# Patient Record
Sex: Female | Born: 1985 | Race: White | Hispanic: No | Marital: Single | Smoking: Never smoker
Health system: Southern US, Community
[De-identification: ages and names within clinical notes are randomized; demographics above are authoritative.]

---

## 2015-10-30 ENCOUNTER — Encounter (HOSPITAL_COMMUNITY): Payer: Self-pay | Admitting: Emergency Medicine

## 2015-10-30 ENCOUNTER — Emergency Department (HOSPITAL_COMMUNITY)
Admission: EM | Admit: 2015-10-30 | Discharge: 2015-10-30 | Disposition: A | Discharge status: Home / Self Care | Payer: Worker's Compensation | Attending: Emergency Medicine | Admitting: Emergency Medicine

## 2015-10-30 ENCOUNTER — Emergency Department (HOSPITAL_COMMUNITY): Payer: Worker's Compensation

## 2015-10-30 DIAGNOSIS — S8992XA Unspecified injury of left lower leg, initial encounter: Secondary | ICD-10-CM | POA: Diagnosis present

## 2015-10-30 DIAGNOSIS — W010XXA Fall on same level from slipping, tripping and stumbling without subsequent striking against object, initial encounter: Secondary | ICD-10-CM | POA: Insufficient documentation

## 2015-10-30 DIAGNOSIS — S8392XA Sprain of unspecified site of left knee, initial encounter: Secondary | ICD-10-CM | POA: Insufficient documentation

## 2015-10-30 DIAGNOSIS — Y9289 Other specified places as the place of occurrence of the external cause: Secondary | ICD-10-CM | POA: Diagnosis not present

## 2015-10-30 DIAGNOSIS — Y999 Unspecified external cause status: Secondary | ICD-10-CM | POA: Diagnosis not present

## 2015-10-30 DIAGNOSIS — S39012A Strain of muscle, fascia and tendon of lower back, initial encounter: Secondary | ICD-10-CM | POA: Diagnosis not present

## 2015-10-30 DIAGNOSIS — S8002XA Contusion of left knee, initial encounter: Secondary | ICD-10-CM | POA: Diagnosis not present

## 2015-10-30 DIAGNOSIS — Y9301 Activity, walking, marching and hiking: Secondary | ICD-10-CM | POA: Insufficient documentation

## 2015-10-30 MED ORDER — HYDROCODONE-ACETAMINOPHEN 5-325 MG PO TABS
2.0000 | ORAL_TABLET | Freq: Once | ORAL | Status: AC
  Administered 2015-10-30: 2 via ORAL
  Filled 2015-10-30: qty 2

## 2015-10-30 MED ORDER — IBUPROFEN 800 MG PO TABS
800.0000 mg | ORAL_TABLET | Freq: Once | ORAL | Status: AC
  Administered 2015-10-30: 800 mg via ORAL
  Filled 2015-10-30: qty 1

## 2015-10-30 MED ORDER — IBUPROFEN 600 MG PO TABS: 600.0000 mg | ORAL_TABLET | Freq: Four times a day (QID) | ORAL | Status: AC | PRN

## 2015-10-30 MED ORDER — HYDROCODONE-ACETAMINOPHEN 5-325 MG PO TABS: 1.0000 | ORAL_TABLET | ORAL | Status: AC | PRN

## 2015-10-30 MED ORDER — DIAZEPAM 5 MG PO TABS
5.0000 mg | ORAL_TABLET | Freq: Once | ORAL | Status: AC
  Administered 2015-10-30: 5 mg via ORAL
  Filled 2015-10-30: qty 1

## 2015-10-30 MED ORDER — METHOCARBAMOL 500 MG PO TABS: 500.0000 mg | ORAL_TABLET | Freq: Three times a day (TID) | ORAL | Status: AC

## 2015-10-30 NOTE — ED Notes (Signed)
Pt verbalized understanding of no driving and to use caution within 4 hours of taking pain meds due to meds cause drowsiness 

## 2015-10-30 NOTE — ED Notes (Signed)
[place] [employer] and slipped and fell walking down the hallway.  C/o left knee pain.

## 2015-10-30 NOTE — ED Provider Notes (Signed)
CSN: [id number]     Arrival date & time 10/30/15  0848 History   First MD Initiated Contact with Patient 10/30/15 [phone]     Chief Complaint  Patient presents with  . Fall  . Knee Pain     (Consider location/radiation/quality/duration/timing/severity/associated sxs/prior Treatment) Patient is a 30 y.o. female presenting with fall and knee pain. The history is provided by the patient.  Fall Pertinent negatives include no abdominal pain, arthralgias, chest pain, coughing or neck pain.  Knee Pain Associated symptoms: no back pain and no neck pain     History reviewed. No pertinent past medical history. History reviewed. No pertinent past surgical history. History reviewed. No pertinent family history. Social History  Substance Use Topics  . Smoking status: Never Smoker   . Smokeless tobacco: None  . Alcohol Use: No   OB History    No data available     Review of Systems  Constitutional: Negative for activity change.       All ROS Neg except as noted in HPI  HENT: Negative for nosebleeds.   Eyes: Negative for photophobia and discharge.  Respiratory: Negative for cough, shortness of breath and wheezing.   Cardiovascular: Negative for chest pain and palpitations.  Gastrointestinal: Negative for abdominal pain and blood in stool.  Genitourinary: Negative for dysuria, frequency and hematuria.  Musculoskeletal: Negative for back pain, arthralgias and neck pain.  Skin: Negative.   Neurological: Negative for dizziness, seizures and speech difficulty.  Psychiatric/Behavioral: Negative for hallucinations and confusion.      Allergies  Review of patient's allergies indicates no known allergies.  Home Medications   Prior to Admission medications   Not on File   BP 101/71 mmHg  Pulse 69  Temp(Src) 98.3 F (36.8 C) (Oral)  Resp 16  Ht 5\' 2"  (1.575 m)  Wt 113.399 kg  BMI 45.71 kg/m2  SpO2 100%  LMP 10/23/2015 Physical Exam  Constitutional: She is oriented to person,  place, and time. She appears well-developed and well-nourished.  Non-toxic appearance.  HENT:  Head: Normocephalic.  Right Ear: Tympanic membrane and external ear normal.  Left Ear: Tympanic membrane and external ear normal.  Eyes: EOM and lids are normal. Pupils are equal, round, and reactive to light.  Neck: Normal range of motion. Neck supple. Carotid bruit is not present.  Cardiovascular: Normal rate, regular rhythm, normal heart sounds, intact distal pulses and normal pulses.   Pulmonary/Chest: Breath sounds normal. No respiratory distress.  Abdominal: Soft. Bowel sounds are normal. There is no tenderness. There is no guarding.  Musculoskeletal: Normal range of motion.  Lymphadenopathy:       Head (right side): No submandibular adenopathy present.       Head (left side): No submandibular adenopathy present.    She has no cervical adenopathy.  Neurological: She is alert and oriented to person, place, and time. She has normal strength. No cranial nerve deficit or sensory deficit.  Skin: Skin is warm and dry.  Psychiatric: She has a normal mood and affect. Her speech is normal.  Nursing note and vitals reviewed.   ED Course  Procedures (including critical care time) Labs Review Labs Reviewed - No data to display  Imaging Review No results found. I have personally reviewed and evaluated these images and lab results as part of my medical decision-making.   EKG Interpretation None      MDM  Vital signs within normal limits. Patient sustained a fall while at work today. The examination favors contusion of  the left knee, and lumbar strain. X-ray of the left knee is negative for fracture dislocation or effusion.  Patient fitted with a knee immobilizer and crutches. Ice pack provided. Prescription for Robaxin, Motrin, and Norco given to the patient. The patient is given a work excuse.    Final diagnoses:  Contusion of left knee, initial encounter  Left knee sprain, initial  encounter  Lumbar strain, initial encounter    **I have reviewed nursing notes, vital signs, and all appropriate lab and imaging results for this patient.Ivery Quale*    Ameera Tigue, PA-C 10/31/15 1359  Glynn OctaveStephen Rancour, MD 10/31/15 [phone]

## 2015-10-30 NOTE — Discharge Instructions (Signed)
Your x-rays are negative for fracture or dislocation. Your examination favors lumbar strain, left knee contusion, and left knee strain. Use the knee immobilizer over the next 5-7 days. Please use crutches until you can safely apply weight to your left lower extremity. Please see Dr. Romeo AppleHarrison for evaluation if not improving. Please use ibuprofen every 6 hours for discomfort and inflammation. Use Robaxin 3 times daily for spasm pain. Use Norco for more severe pain. Norco and Robaxin may cause drowsiness. Please do not drink alcohol, operate a vehicle, operating machinery, or participate in activities requiring concentration when taking either these 2 medications. Please apply ice pack to your knee. Knee Sprain A knee sprain is a tear in the strong bands of tissue that connect the bones (ligaments) of your knee. HOME CARE  Raise (elevate) your injured knee to lessen puffiness (swelling).  To ease pain and puffiness, put ice on the injured area.  Put ice in a plastic bag.  Place a towel between your skin and the bag.  Leave the ice on for 20 minutes, 2-3 times a day.  Only take medicine as told by your doctor.  Do not leave your knee unprotected until pain and stiffness go away (usually 4-6 weeks).  If you have a cast or splint, do not get it wet. If your doctor told you to not take it off, cover it with a plastic bag when you shower or bathe. Do not swim.  Your doctor may have you do exercises to prevent or limit permanent weakness and stiffness. GET HELP RIGHT AWAY IF:   Your cast or splint becomes damaged.  Your pain gets worse.  You have a lot of pain, puffiness, or numbness below the cast or splint. MAKE SURE YOU:   Understand these instructions.  Will watch your condition.  Will get help right away if you are not doing well or get worse.   This information is not intended to replace advice given to you by your health care provider. Make sure you discuss any questions you have  with your health care provider.   Document Released: 04/05/2009 Document Revised: 04/22/2013 Document Reviewed: 12/24/2012 Elsevier Interactive Patient Education 2016 [employer]  Lumbosacral Strain Lumbosacral strain is a strain of any of the parts that make up your lumbosacral vertebrae. Your lumbosacral vertebrae are the bones that make up the lower third of your backbone. Your lumbosacral vertebrae are held together by muscles and tough, fibrous tissue (ligaments).  CAUSES  A sudden blow to your back can cause lumbosacral strain. Also, anything that causes an excessive stretch of the muscles in the low back can cause this strain. This is typically seen when people exert themselves strenuously, fall, lift heavy objects, bend, or crouch repeatedly. RISK FACTORS  Physically demanding work.  Participation in pushing or pulling sports or sports that require a sudden twist of the back (tennis, golf, baseball).  Weight lifting.  Excessive lower back curvature.  Forward-tilted pelvis.  Weak back or abdominal muscles or both.  Tight hamstrings. SIGNS AND SYMPTOMS  Lumbosacral strain may cause pain in the area of your injury or pain that moves (radiates) down your leg.  DIAGNOSIS Your health care provider can often diagnose lumbosacral strain through a physical exam. In some cases, you may need tests such as X-ray exams.  TREATMENT  Treatment for your lower back injury depends on many factors that your clinician will have to evaluate. However, most treatment will include the use of anti-inflammatory medicines. HOME CARE INSTRUCTIONS  Avoid hard physical activities (tennis, racquetball, waterskiing) if you are not in proper physical condition for it. This may aggravate or create problems.  If you have a back problem, avoid sports requiring sudden body movements. Swimming and walking are generally safer activities.  Maintain good posture.  Maintain a healthy weight.  For acute  conditions, you may put ice on the injured area.  Put ice in a plastic bag.  Place a towel between your skin and the bag.  Leave the ice on for 20 minutes, 2-3 times a day.  When the low back starts healing, stretching and strengthening exercises may be recommended. SEEK MEDICAL CARE IF:  Your back pain is getting worse.  You experience severe back pain not relieved with medicines. SEEK IMMEDIATE MEDICAL CARE IF:   You have numbness, tingling, weakness, or problems with the use of your arms or legs.  There is a change in bowel or bladder control.  You have increasing pain in any area of the body, including your belly (abdomen).  You notice shortness of breath, dizziness, or feel faint.  You feel sick to your stomach (nauseous), are throwing up (vomiting), or become sweaty.  You notice discoloration of your toes or legs, or your feet get very cold. MAKE SURE YOU:   Understand these instructions.  Will watch your condition.  Will get help right away if you are not doing well or get worse.   This information is not intended to replace advice given to you by your health care provider. Make sure you discuss any questions you have with your health care provider.   Document Released: 01/25/2005 Document Revised: 05/08/2014 Document Reviewed: 12/04/2012 Elsevier Interactive Patient Education [employer]

## 2016-12-04 IMAGING — DX DG KNEE COMPLETE 4+V*L*
4 series · 4 of 4 positions shown · non-contrast
Comparison: None.

CLINICAL DATA: Fall today with left knee pain, initial encounter

EXAM:
LEFT KNEE - COMPLETE 4+ VIEW

[knee ap]
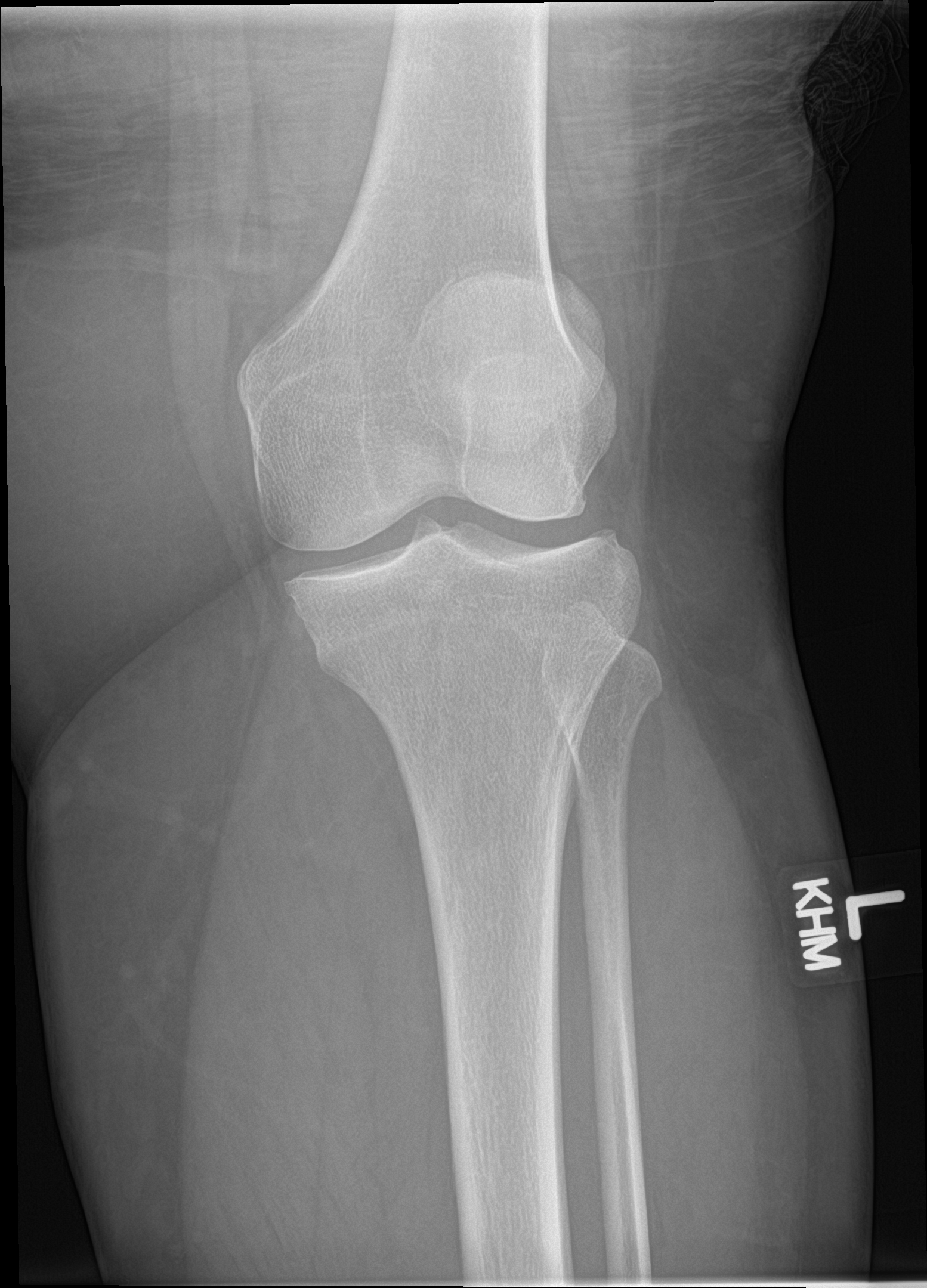

[tunnel]
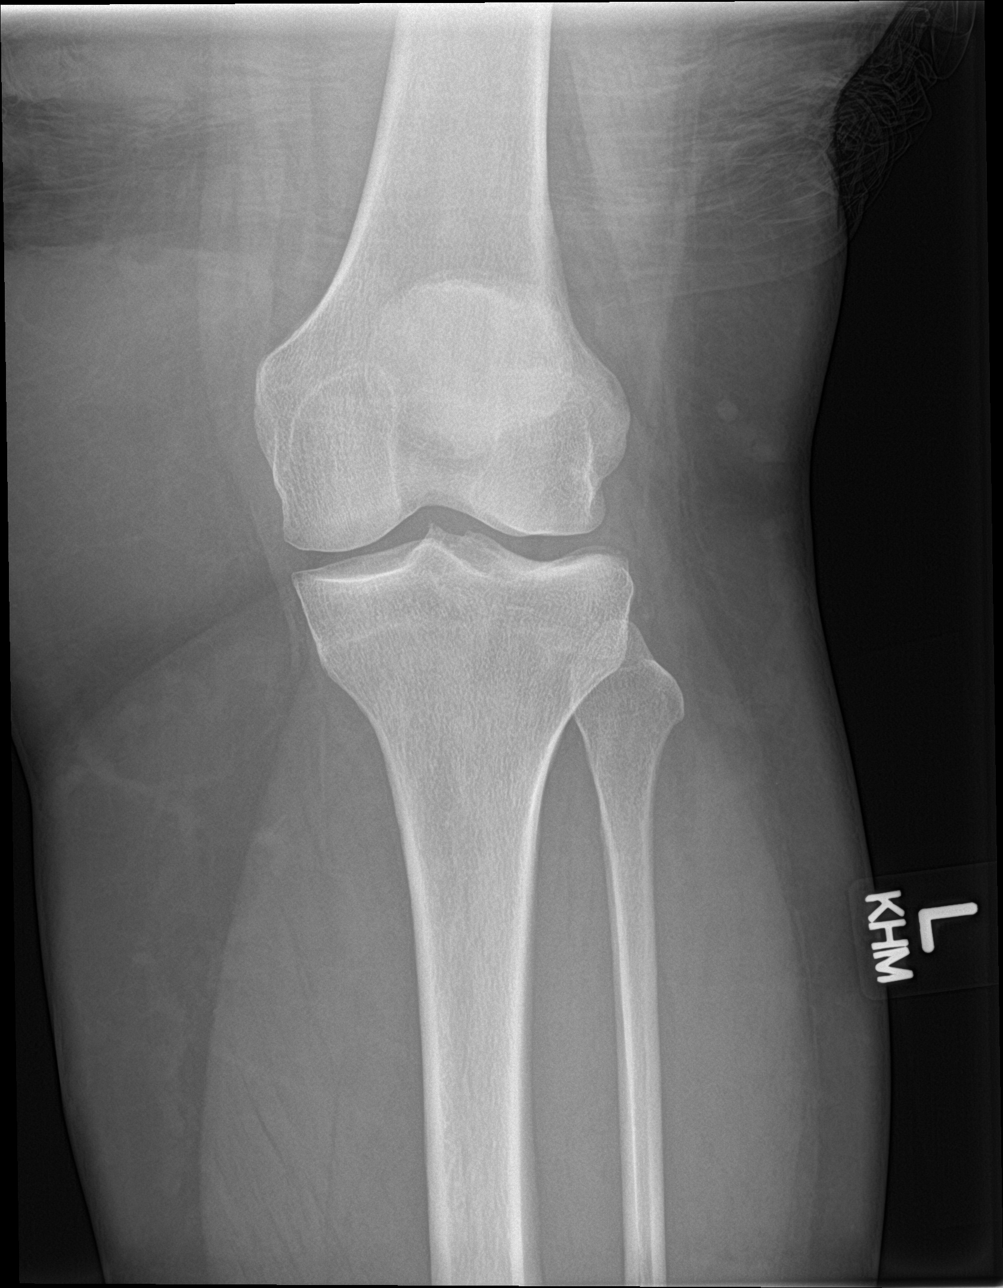

[knee lat (1 of 2)]
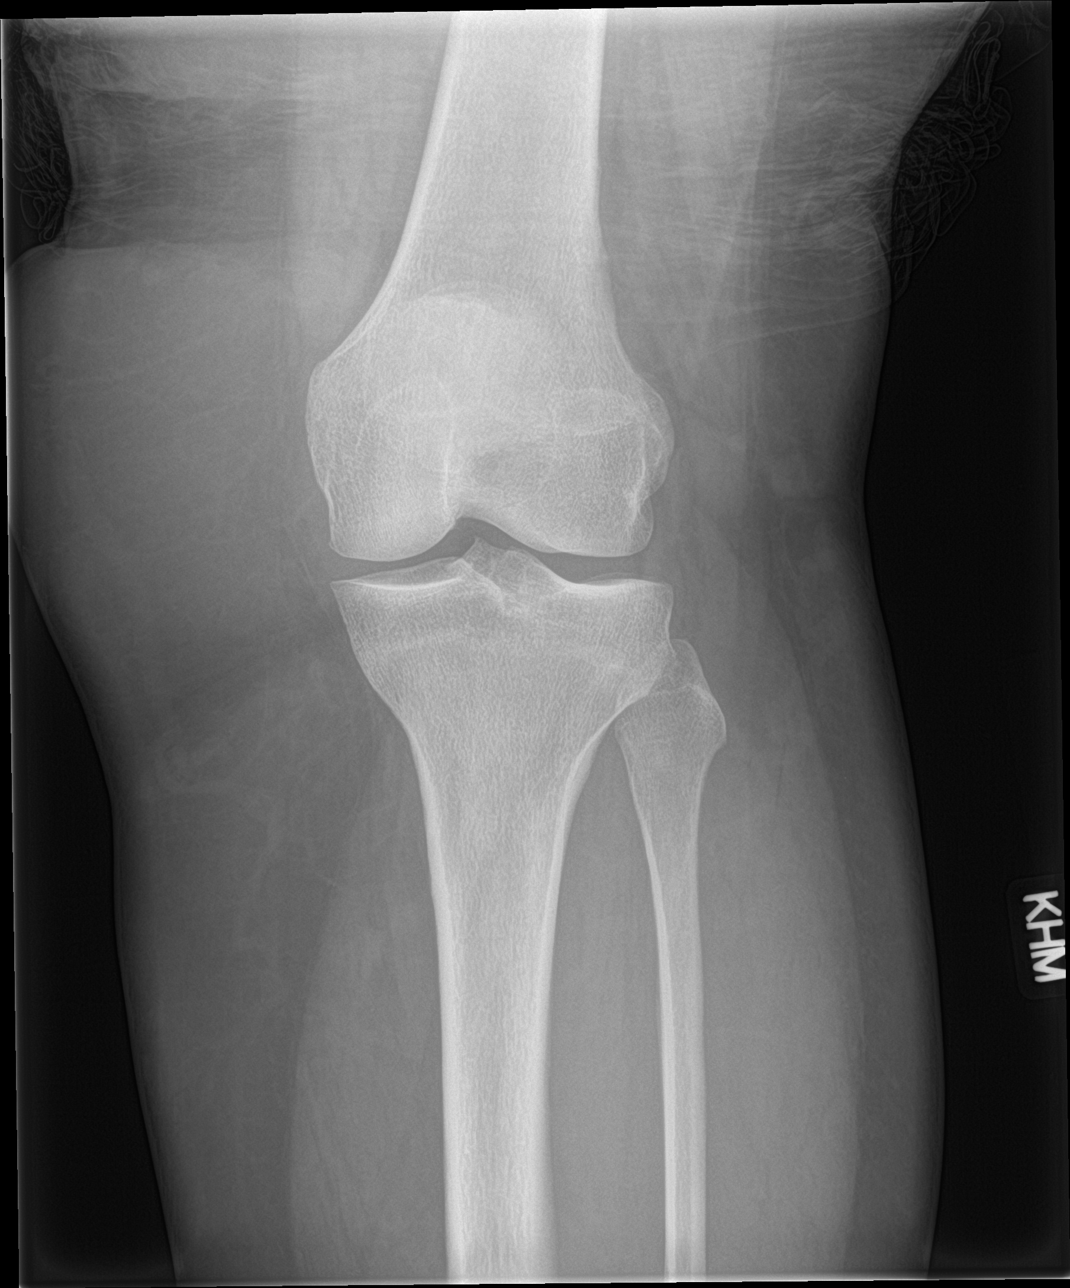

[knee lat (2 of 2)]
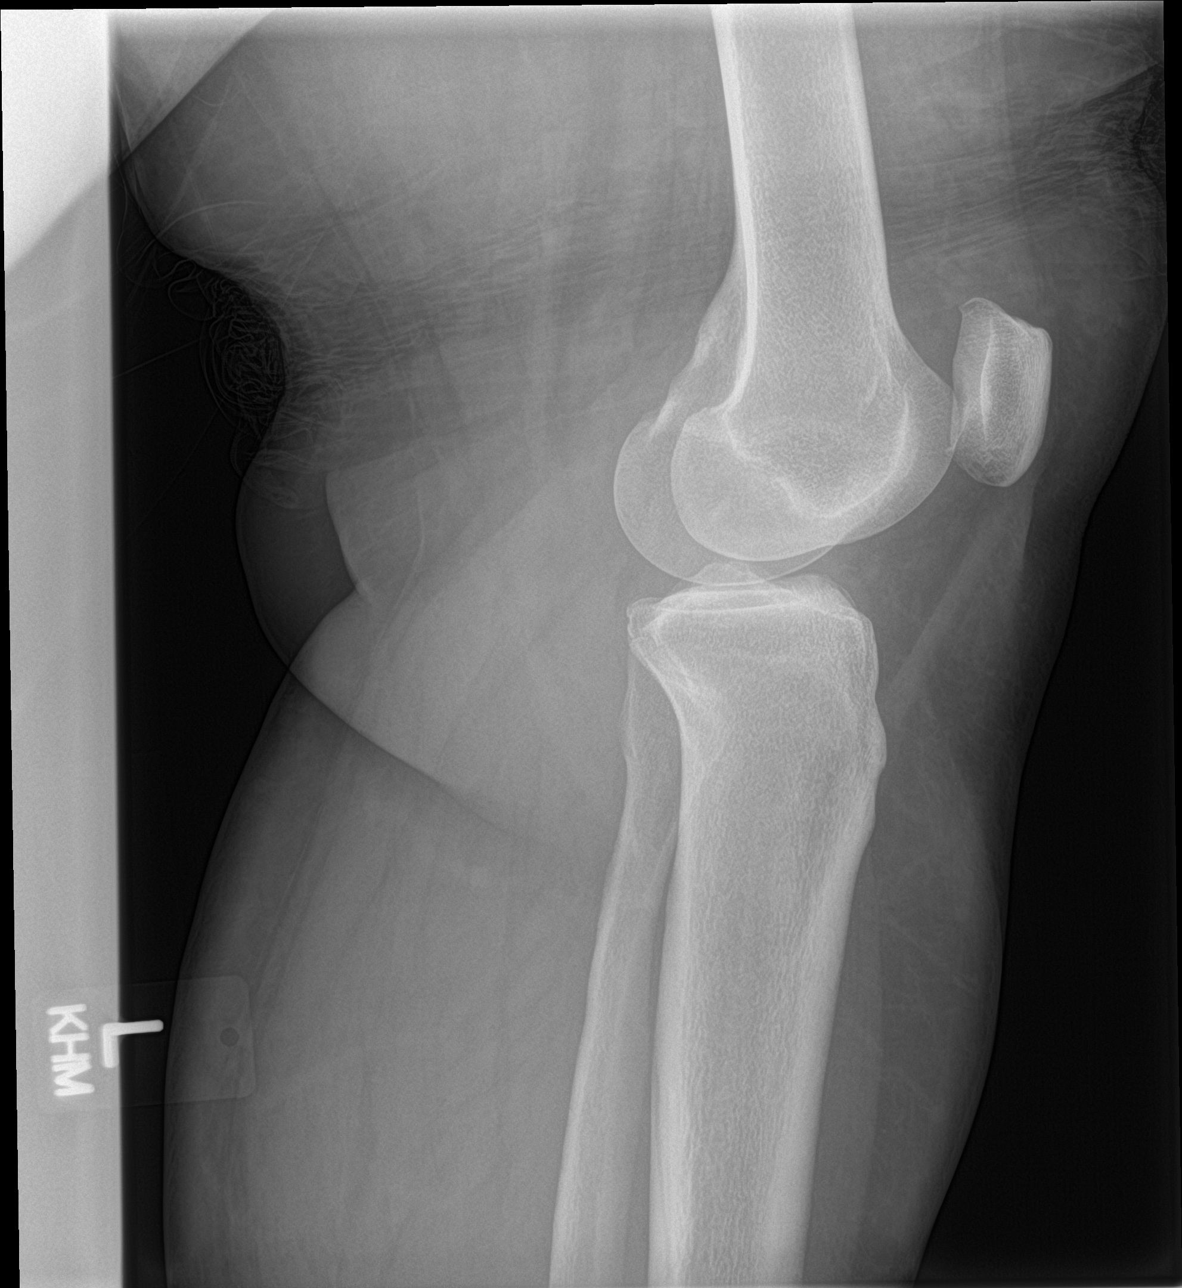

[4 of 4 positions shown; findings below may reference images not displayed]

FINDINGS: No evidence of fracture, dislocation, or joint effusion. No evidence
of arthropathy or other focal bone abnormality. Soft tissues are
unremarkable.
IMPRESSION: No acute abnormality noted.
